# Patient Record
Sex: Male | Born: 1995 | Race: White | Hispanic: No | Marital: Single | Smoking: Never smoker
Health system: Southern US, Community
[De-identification: ages and names within clinical notes are randomized; demographics above are authoritative.]

---
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
## 2020-02-06 ENCOUNTER — Encounter (HOSPITAL_COMMUNITY): Payer: Self-pay | Admitting: Physician Assistant

## 2020-02-06 ENCOUNTER — Other Ambulatory Visit: Payer: Self-pay

## 2020-02-06 ENCOUNTER — Emergency Department (HOSPITAL_COMMUNITY)
Admission: EM | Admit: 2020-02-06 | Discharge: 2020-02-06 | Disposition: A | Discharge status: Home / Self Care | Payer: Medicaid Other | Attending: Emergency Medicine | Admitting: Emergency Medicine

## 2020-02-06 DIAGNOSIS — E860 Dehydration: Secondary | ICD-10-CM

## 2020-02-06 DIAGNOSIS — R112 Nausea with vomiting, unspecified: Secondary | ICD-10-CM

## 2020-02-06 DIAGNOSIS — R5383 Other fatigue: Secondary | ICD-10-CM | POA: Diagnosis not present

## 2020-02-06 DIAGNOSIS — R1110 Vomiting, unspecified: Secondary | ICD-10-CM | POA: Diagnosis present

## 2020-02-06 LAB — URINALYSIS, ROUTINE W REFLEX MICROSCOPIC
Bacteria, UA: NONE SEEN
Bilirubin Urine: NEGATIVE
Glucose, UA: NEGATIVE mg/dL
Hgb urine dipstick: NEGATIVE
Ketones, ur: 80 mg/dL — AB
Leukocytes,Ua: NEGATIVE
Nitrite: NEGATIVE
Protein, ur: 100 mg/dL — AB
Specific Gravity, Urine: 1.035 — ABNORMAL HIGH (ref 1.005–1.030)
pH: 5 (ref 5.0–8.0)

## 2020-02-06 LAB — COMPREHENSIVE METABOLIC PANEL
ALT: 22 U/L (ref 0–44)
AST: 28 U/L (ref 15–41)
Albumin: 5.3 g/dL — ABNORMAL HIGH (ref 3.5–5.0)
Alkaline Phosphatase: 101 U/L (ref 38–126)
Anion gap: 18 — ABNORMAL HIGH (ref 5–15)
BUN: 26 mg/dL — ABNORMAL HIGH (ref 6–20)
CO2: 20 mmol/L — ABNORMAL LOW (ref 22–32)
Calcium: 9.7 mg/dL (ref 8.9–10.3)
Chloride: 97 mmol/L — ABNORMAL LOW (ref 98–111)
Creatinine, Ser: <0.3 mg/dL — ABNORMAL LOW (ref 0.61–1.24)
Glucose, Bld: 114 mg/dL — ABNORMAL HIGH (ref 70–99)
Potassium: 3.9 mmol/L (ref 3.5–5.1)
Sodium: 135 mmol/L (ref 135–145)
Total Bilirubin: <0.1 mg/dL — ABNORMAL LOW (ref 0.3–1.2)
Total Protein: 4.4 g/dL — ABNORMAL LOW (ref 6.5–8.1)

## 2020-02-06 LAB — CBC
HCT: 42.6 % (ref 39.0–52.0)
Hemoglobin: 15 g/dL (ref 13.0–17.0)
MCH: 29.5 pg (ref 26.0–34.0)
MCHC: 35.2 g/dL (ref 30.0–36.0)
MCV: 83.9 fL (ref 80.0–100.0)
Platelets: 388 10*3/uL (ref 150–400)
RBC: 5.08 MIL/uL (ref 4.22–5.81)
RDW: 12.1 % (ref 11.5–15.5)
WBC: 13.2 10*3/uL — ABNORMAL HIGH (ref 4.0–10.5)
nRBC: 0 % (ref 0.0–0.2)

## 2020-02-06 LAB — LIPASE, BLOOD: Lipase: 18 U/L (ref 11–51)

## 2020-02-06 LAB — CK: Total CK: 282 U/L (ref 49–397)

## 2020-02-06 MED ORDER — ONDANSETRON HCL 4 MG PO TABS
4.0000 mg | ORAL_TABLET | Freq: Three times a day (TID) | ORAL | 0 refills | Status: DC | PRN
Start: 2020-02-06 — End: 2020-02-08

## 2020-02-06 MED ORDER — LACTATED RINGERS IV BOLUS
1000.0000 mL | Freq: Once | INTRAVENOUS | Status: AC
  Administered 2020-02-06: 1000 mL via INTRAVENOUS

## 2020-02-06 MED ORDER — PROMETHAZINE HCL 25 MG/ML IJ SOLN
12.5000 mg | Freq: Once | INTRAMUSCULAR | Status: AC
  Administered 2020-02-06: 12.5 mg via INTRAVENOUS
  Filled 2020-02-06: qty 1

## 2020-02-06 MED ORDER — PROMETHAZINE HCL 25 MG/ML IJ SOLN
25.0000 mg | Freq: Once | INTRAMUSCULAR | Status: AC
  Administered 2020-02-06: 25 mg via INTRAVENOUS
  Filled 2020-02-06: qty 1

## 2020-02-06 MED ORDER — LACTATED RINGERS IV BOLUS
2000.0000 mL | Freq: Once | INTRAVENOUS | Status: AC
  Administered 2020-02-06: 2000 mL via INTRAVENOUS

## 2020-02-06 MED ORDER — ONDANSETRON 4 MG PO TBDP
4.0000 mg | ORAL_TABLET | Freq: Once | ORAL | Status: AC | PRN
  Administered 2020-02-06: 4 mg via ORAL
  Filled 2020-02-06: qty 1

## 2020-02-06 NOTE — ED Notes (Addendum)
Patient requesting nausea meds before lab draw.Nancee Liter, RN made aware.

## 2020-02-06 NOTE — ED Provider Notes (Signed)
[facility]-EMERGENCY DEPT Provider Note   CSN: [id number] Arrival date & time: 02/06/20  1341     History Chief Complaint  Patient presents with  . Emesis    Juan Ross is a 24 y.o. male who presents today for evaluation of emesis.  He reports that Friday at noon he was working in [employer] when he got very hot and overheated and began vomiting.  He states that since then he has been continuously vomiting.  He denies any diarrhea.  He reports that his abdomen feels slightly uncomfortable due to the nausea however denies abdominal pain.  No fevers.  He has not been vaccinated against Covid and has not been previously diagnosed with Covid.  He denies any cough shortness of breath or headache.  Last bowel movement was a few days ago.  He denies any prior abdominal surgeries.  He states last time he smoked marijuana was 2 weeks ago.  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   HPI     History reviewed. No pertinent past medical history.  There are no problems to display for this patient.   History reviewed. No pertinent surgical history.     History reviewed. No pertinent family history.  Social History   Tobacco Use  . Smoking status: Never Smoker  . Smokeless tobacco: Never Used  Substance Use Topics  . Alcohol use: Not on file  . Drug use: Not on file    Home Medications Prior to Admission medications   Not on File    Allergies    Patient has no known allergies.  Review of Systems   Review of Systems  Constitutional: Positive for fatigue. Negative for chills and fever.  Eyes: Negative for visual disturbance.  Respiratory: Negative for cough, chest tightness and shortness of breath.   Cardiovascular: Negative for chest pain.  Gastrointestinal: Positive for nausea and vomiting. Negative for abdominal pain, constipation and diarrhea.  Genitourinary: Negative for decreased urine volume and  dysuria.  Musculoskeletal: Negative for back pain.  Skin: Negative for color change.  Neurological: Negative for weakness and headaches.  Psychiatric/Behavioral: Negative for confusion.  All other systems reviewed and are negative.   Physical Exam Updated Vital Signs BP 132/78   Pulse 68   Temp 98.5 F (36.9 C) (Oral)   Resp 16   Ht 5\' 4"  (1.626 m)   Wt 54.4 kg   SpO2 100%   BMI 20.60 kg/m   Physical Exam Vitals and nursing note reviewed.  Constitutional:      Appearance: He is well-developed.     Comments: Actively vomiting and dry heaving  HENT:     Head: Normocephalic and atraumatic.     Mouth/Throat:     Mouth: Mucous membranes are moist.  Eyes:     Conjunctiva/sclera: Conjunctivae normal.  Cardiovascular:     Rate and Rhythm: Normal rate and regular rhythm.     Pulses: Normal pulses.     Heart sounds: Normal heart sounds. No murmur.  Pulmonary:     Effort: Pulmonary effort is normal. No respiratory distress.     Breath sounds: Normal breath sounds.  Abdominal:     General: There is no distension.     Palpations: Abdomen is soft.     Tenderness: There is no abdominal tenderness.  Musculoskeletal:     Cervical back: Normal range of motion and neck supple.     Right lower leg: No edema.  Left lower leg: No edema.  Skin:    General: Skin is warm and dry.  Neurological:     Mental Status: He is alert.     Comments: Patient is awake and alert, speech is not slurred.  Answers questions appropriately.  Psychiatric:        Mood and Affect: Mood normal.        Behavior: Behavior normal.     ED Results / Procedures / Treatments   Labs (all labs ordered are listed, but only abnormal results are displayed) Labs Reviewed  COMPREHENSIVE METABOLIC PANEL - Abnormal; Notable for the following components:      Result Value   Chloride 97 (*)    CO2 20 (*)    Glucose, Bld 114 (*)    BUN 26 (*)    Creatinine, Ser <0.30 (*)    Total Protein 4.4 (*)    Albumin  5.3 (*)    Total Bilirubin <0.1 (*)    Anion gap 18 (*)    All other components within normal limits  CBC - Abnormal; Notable for the following components:   WBC 13.2 (*)    All other components within normal limits  URINALYSIS, ROUTINE W REFLEX MICROSCOPIC - Abnormal; Notable for the following components:   Specific Gravity, Urine 1.035 (*)    Ketones, ur 80 (*)    Protein, ur 100 (*)    All other components within normal limits  LIPASE, BLOOD  CK    EKG EKG Interpretation  Date/Time:  Saturday February 06 2020 17:26:19 EDT Ventricular Rate:  58 PR Interval:    QRS Duration: 122 QT Interval:  438 QTC Calculation: 431 R Axis:   83 Text Interpretation: Sinus rhythm Atrial premature complex Borderline short PR interval Nonspecific intraventricular conduction delay Nonspecific T abnrm, anterolateral leads No old tracing to compare Confirmed by Meridee Score [phone]) on 02/06/2020 5:42:41 PM   Radiology No results found.  Procedures Procedures (including critical care time)  Medications Ordered in ED Medications  ondansetron (ZOFRAN-ODT) disintegrating tablet 4 mg (4 mg Oral Given 02/06/20 1551)  promethazine (PHENERGAN) injection 12.5 mg (12.5 mg Intravenous Given 02/06/20 1647)  lactated ringers bolus 2,000 mL (0 mLs Intravenous Stopped 02/06/20 1756)    ED Course  I have reviewed the triage vital signs and the nursing notes.  Pertinent labs & imaging results that were available during my care of the patient were reviewed by me and considered in my medical decision making (see chart for details).    MDM Rules/Calculators/A&P                     Patient is a 24 year old man who presents today for evaluation of vomiting for approximately 24 hours.  On my exam he is actively vomiting and dry heaving.  He had been given Zofran ODT however this does not appear to have been effective.  2 L of IV fluids ordered in addition to IV Phenergan.  Given that this started while he was working  in a hot area CK was obtained which was not significantly elevated, white count slightly elevated at 13.2 which I suspect may be reactive.  CMP is significant for an anion gap of 18 with a CO2 of 20 which I suspect is secondary to vomiting.  His abdomen is soft, nontender nondistended.  He is afebrile here.  UA consistent with dehydration with elevated specific gravity and 80 ketones.  At this time patient is pending fluids, improvement in symptoms and  p.o. challenge along with repeat abdomen exam.  At shift change care was transferred to Dr. Melina Copa  who will follow pending studies, re-evaulate and determine disposition.     Final Clinical Impression(s) / ED Diagnoses Final diagnoses:  Non-intractable vomiting with nausea, unspecified vomiting type  Dehydration    Rx / DC Orders ED Discharge Orders    None       Ollen Gross 02/06/20 1848    Hayden Rasmussen, MD 02/07/20 [phone]

## 2020-02-06 NOTE — ED Triage Notes (Signed)
Per patient, he was working at his job yesterday and got too hot. Started vomiting around noon and hasnt been able to stop since. Denies pain

## 2020-02-08 ENCOUNTER — Other Ambulatory Visit: Payer: Self-pay

## 2020-02-08 ENCOUNTER — Encounter (HOSPITAL_COMMUNITY): Payer: Self-pay

## 2020-02-08 ENCOUNTER — Emergency Department (HOSPITAL_COMMUNITY)
Admission: EM | Admit: 2020-02-08 | Discharge: 2020-02-08 | Disposition: A | Discharge status: Home / Self Care | Payer: Medicaid Other | Attending: Emergency Medicine | Admitting: Emergency Medicine

## 2020-02-08 ENCOUNTER — Emergency Department (HOSPITAL_COMMUNITY): Payer: Medicaid Other

## 2020-02-08 DIAGNOSIS — R112 Nausea with vomiting, unspecified: Secondary | ICD-10-CM | POA: Diagnosis not present

## 2020-02-08 DIAGNOSIS — R1030 Lower abdominal pain, unspecified: Secondary | ICD-10-CM | POA: Insufficient documentation

## 2020-02-08 LAB — CBC WITH DIFFERENTIAL/PLATELET
Abs Immature Granulocytes: 0.05 10*3/uL (ref 0.00–0.07)
Basophils Absolute: 0 10*3/uL (ref 0.0–0.1)
Basophils Relative: 0 %
Eosinophils Absolute: 0 10*3/uL (ref 0.0–0.5)
Eosinophils Relative: 0 %
HCT: 36.3 % — ABNORMAL LOW (ref 39.0–52.0)
Hemoglobin: 13 g/dL (ref 13.0–17.0)
Immature Granulocytes: 1 %
Lymphocytes Relative: 9 %
Lymphs Abs: 0.8 10*3/uL (ref 0.7–4.0)
MCH: 29.7 pg (ref 26.0–34.0)
MCHC: 35.8 g/dL (ref 30.0–36.0)
MCV: 82.9 fL (ref 80.0–100.0)
Monocytes Absolute: 0.7 10*3/uL (ref 0.1–1.0)
Monocytes Relative: 8 %
Neutro Abs: 7 10*3/uL (ref 1.7–7.7)
Neutrophils Relative %: 82 %
Platelets: 297 10*3/uL (ref 150–400)
RBC: 4.38 MIL/uL (ref 4.22–5.81)
RDW: 11.9 % (ref 11.5–15.5)
WBC: 8.6 10*3/uL (ref 4.0–10.5)
nRBC: 0 % (ref 0.0–0.2)

## 2020-02-08 LAB — LIPASE, BLOOD: Lipase: 33 U/L (ref 11–51)

## 2020-02-08 LAB — COMPREHENSIVE METABOLIC PANEL
ALT: 18 U/L (ref 0–44)
AST: 22 U/L (ref 15–41)
Albumin: 4.8 g/dL (ref 3.5–5.0)
Alkaline Phosphatase: 85 U/L (ref 38–126)
Anion gap: 16 — ABNORMAL HIGH (ref 5–15)
BUN: 15 mg/dL (ref 6–20)
CO2: 27 mmol/L (ref 22–32)
Calcium: 9.2 mg/dL (ref 8.9–10.3)
Chloride: 93 mmol/L — ABNORMAL LOW (ref 98–111)
Creatinine, Ser: 0.83 mg/dL (ref 0.61–1.24)
GFR calc Af Amer: >60 mL/min (ref >60)
GFR calc non Af Amer: >60 mL/min (ref >60)
Glucose, Bld: 110 mg/dL — ABNORMAL HIGH (ref 70–99)
Potassium: 3.1 mmol/L — ABNORMAL LOW (ref 3.5–5.1)
Sodium: 136 mmol/L (ref 135–145)
Total Bilirubin: 1.5 mg/dL — ABNORMAL HIGH (ref 0.3–1.2)
Total Protein: 7.7 g/dL (ref 6.5–8.1)

## 2020-02-08 LAB — URINALYSIS, ROUTINE W REFLEX MICROSCOPIC
Bilirubin Urine: NEGATIVE
Glucose, UA: NEGATIVE mg/dL
Hgb urine dipstick: NEGATIVE
Ketones, ur: 80 mg/dL — AB
Leukocytes,Ua: NEGATIVE
Nitrite: NEGATIVE
Protein, ur: NEGATIVE mg/dL
Specific Gravity, Urine: 1.039 — ABNORMAL HIGH (ref 1.005–1.030)
pH: 7 (ref 5.0–8.0)

## 2020-02-08 LAB — MAGNESIUM: Magnesium: 2.3 mg/dL (ref 1.7–2.4)

## 2020-02-08 MED ORDER — LORAZEPAM 2 MG/ML IJ SOLN
0.5000 mg | Freq: Once | INTRAMUSCULAR | Status: AC
  Administered 2020-02-08: 0.5 mg via INTRAVENOUS
  Filled 2020-02-08: qty 1

## 2020-02-08 MED ORDER — POTASSIUM CHLORIDE ER 10 MEQ PO TBCR: 20.0000 meq | EXTENDED_RELEASE_TABLET | Freq: Every day | ORAL | 0 refills | Status: AC

## 2020-02-08 MED ORDER — SODIUM CHLORIDE 0.9% FLUSH
3.0000 mL | Freq: Once | INTRAVENOUS | Status: AC
  Administered 2020-02-08: 3 mL via INTRAVENOUS

## 2020-02-08 MED ORDER — SODIUM CHLORIDE 0.9 % IV BOLUS
1000.0000 mL | Freq: Once | INTRAVENOUS | Status: AC
  Administered 2020-02-08: 1000 mL via INTRAVENOUS

## 2020-02-08 MED ORDER — ONDANSETRON HCL 4 MG/2ML IJ SOLN
4.0000 mg | Freq: Once | INTRAMUSCULAR | Status: AC
  Administered 2020-02-08: 4 mg via INTRAVENOUS
  Filled 2020-02-08: qty 2

## 2020-02-08 MED ORDER — SODIUM CHLORIDE 0.9 % IV BOLUS
500.0000 mL | Freq: Once | INTRAVENOUS | Status: AC
  Administered 2020-02-08: 500 mL via INTRAVENOUS

## 2020-02-08 MED ORDER — SODIUM CHLORIDE (PF) 0.9 % IJ SOLN
INTRAMUSCULAR | Status: AC
  Filled 2020-02-08: qty 50

## 2020-02-08 MED ORDER — POTASSIUM CHLORIDE CRYS ER 20 MEQ PO TBCR
40.0000 meq | EXTENDED_RELEASE_TABLET | Freq: Once | ORAL | Status: DC
  Filled 2020-02-08: qty 2

## 2020-02-08 MED ORDER — MORPHINE SULFATE (PF) 2 MG/ML IV SOLN
2.0000 mg | Freq: Once | INTRAVENOUS | Status: AC
  Administered 2020-02-08: 2 mg via INTRAVENOUS
  Filled 2020-02-08: qty 1

## 2020-02-08 MED ORDER — PROMETHAZINE HCL 12.5 MG PO TABS: 12.5000 mg | ORAL_TABLET | Freq: Four times a day (QID) | ORAL | 0 refills | Status: AC | PRN

## 2020-02-08 MED ORDER — POTASSIUM CHLORIDE 10 MEQ/100ML IV SOLN
10.0000 meq | Freq: Once | INTRAVENOUS | Status: AC
  Administered 2020-02-08: 10 meq via INTRAVENOUS
  Filled 2020-02-08: qty 100

## 2020-02-08 MED ORDER — HALOPERIDOL LACTATE 5 MG/ML IJ SOLN
2.0000 mg | Freq: Once | INTRAMUSCULAR | Status: AC
  Administered 2020-02-08: 2 mg via INTRAVENOUS
  Filled 2020-02-08: qty 1

## 2020-02-08 MED ORDER — IOHEXOL 300 MG/ML  SOLN
100.0000 mL | Freq: Once | INTRAMUSCULAR | Status: AC | PRN
  Administered 2020-02-08: 100 mL via INTRAVENOUS

## 2020-02-08 NOTE — ED Provider Notes (Signed)
[facility]-EMERGENCY DEPT Provider Note   CSN: [id number] Arrival date & time: 02/08/20  2703     History Chief Complaint  Patient presents with  . Emesis    Juan Ross is a 24 y.o. male otherwise healthy no daily medication use presents today for nausea vomiting.  Patient reports nausea and vomiting began on Friday after working out heat on 02/06/2020.  He reports multiple episodes of nonbloody/nonbilious emesis since that time.  He reports he was seen in the ER that same day and had blood work done and received IV fluids, he reports he was discharged with nausea medication which has not helped with his symptoms.  Patient reports that over the last day he has developed abdominal pain he describes an aching sensation moderate primarily in the lower abdomen constant worsened with vomiting no alleviating factors.  Denies fever, headache, sore throat, chest pain/shortness of breath, diarrhea, dysuria/hematuria, testicular pain/swelling or any additional concerns.  Of note patient reports that he has used marijuana in the past but has not smoked in the last 2 weeks. HPI     History reviewed. No pertinent past medical history.  There are no problems to display for this patient.   History reviewed. No pertinent surgical history.     History reviewed. No pertinent family history.  Social History   Tobacco Use  . Smoking status: Never Smoker  . Smokeless tobacco: Never Used  Substance Use Topics  . Alcohol use: Not on file  . Drug use: Not on file    Home Medications Prior to Admission medications   Medication Sig Start Date End Date Taking? Authorizing Provider  potassium chloride (KLOR-CON) 10 MEQ tablet Take 2 tablets (20 mEq total) by mouth daily for 3 days. 02/08/20 02/11/20  Harlene Salts A, PA-C  promethazine (PHENERGAN) 12.5 MG tablet Take 1 tablet (12.5 mg total) by mouth every 6 (six) hours as needed for nausea or vomiting. 02/08/20   Bill Salinas, PA-C    Allergies    Patient has no known allergies.  Review of Systems   Review of Systems Ten systems are reviewed and are negative for acute change except as noted in the HPI Physical Exam Updated Vital Signs BP 126/71 (BP Location: Right Arm)   Pulse 92   Temp 99.1 F (37.3 C) (Oral)   Resp 20   Ht 5\' 4"  (1.626 m)   Wt 55 kg   SpO2 99%   BMI 20.81 kg/m   Physical Exam Constitutional:      General: He is not in acute distress.    Appearance: Normal appearance. He is well-developed. He is not ill-appearing or diaphoretic.  HENT:     Head: Normocephalic and atraumatic.  Eyes:     General: Vision grossly intact. Gaze aligned appropriately.     Pupils: Pupils are equal, round, and reactive to light.  Neck:     Trachea: Trachea and phonation normal.  Pulmonary:     Effort: Pulmonary effort is normal. No respiratory distress.  Abdominal:     General: There is no distension.     Palpations: Abdomen is soft.     Tenderness: There is abdominal tenderness in the right lower quadrant. There is no guarding or rebound. Positive signs include McBurney's sign. Negative signs include Murphy's sign.  Musculoskeletal:        General: Normal range of motion.     Cervical back: Normal range of motion.  Skin:    General: Skin  is warm and dry.  Neurological:     Mental Status: He is alert.     GCS: GCS eye subscore is 4. GCS verbal subscore is 5. GCS motor subscore is 6.     Comments: Speech is clear and goal oriented, follows commands Major Cranial nerves without deficit, no facial droop Moves extremities without ataxia, coordination intact  Psychiatric:        Behavior: Behavior normal.     ED Results / Procedures / Treatments   Labs (all labs ordered are listed, but only abnormal results are displayed) Labs Reviewed  COMPREHENSIVE METABOLIC PANEL - Abnormal; Notable for the following components:      Result Value   Potassium 3.1 (*)    Chloride 93 (*)     Glucose, Bld 110 (*)    Total Bilirubin 1.5 (*)    Anion gap 16 (*)    All other components within normal limits  URINALYSIS, ROUTINE W REFLEX MICROSCOPIC - Abnormal; Notable for the following components:   Specific Gravity, Urine 1.039 (*)    Ketones, ur 80 (*)    All other components within normal limits  CBC WITH DIFFERENTIAL/PLATELET - Abnormal; Notable for the following components:   HCT 36.3 (*)    All other components within normal limits  LIPASE, BLOOD  MAGNESIUM    EKG EKG Interpretation  Date/Time:  Monday February 08 2020 09:41:40 EDT Ventricular Rate:  55 PR Interval:    QRS Duration: 115 QT Interval:  435 QTC Calculation: 416 R Axis:   83 Text Interpretation: Sinus rhythm Nonspecific intraventricular conduction delay No significant change since last tracing Confirmed by Dorie Rank [phone]) on 02/08/2020 9:44:44 AM   Radiology CT ABDOMEN PELVIS W CONTRAST  Result Date: 02/08/2020 CLINICAL DATA:  Unable to keep anything down since Thursday, stomach soreness, malaise, RIGHT lower quadrant pain EXAM: CT ABDOMEN AND PELVIS WITH CONTRAST TECHNIQUE: Multidetector CT imaging of the abdomen and pelvis was performed using the standard protocol following bolus administration of intravenous contrast. Sagittal and coronal MPR images reconstructed from axial data set. CONTRAST:  118mL OMNIPAQUE IOHEXOL 300 MG/ML SOLN IV. No oral contrast. COMPARISON:  12/18/2018 FINDINGS: Lower chest: Lung bases clear Hepatobiliary: Minimal focal fatty infiltration of the liver. Gallbladder and liver otherwise normal appearance Pancreas: Normal appearance Spleen: Normal appearance Adrenals/Urinary Tract: Adrenal glands, kidneys, ureters and bladder normal appearance Stomach/Bowel: Normal appendix. Stomach and bowel loops normal appearance Vascular/Lymphatic: Aorta normal caliber. Vascular structures patent. No adenopathy. Scattered pelvic phleboliths. Reproductive: Unremarkable Other: Small umbilical hernia  containing fat. No free air or free fluid. No inflammatory process. Musculoskeletal: Unremarkable IMPRESSION: Small umbilical hernia containing fat. No acute intra-abdominal or intrapelvic abnormalities. Electronically Signed   By: Lavonia Dana M.D.   On: 02/08/2020 09:39    Procedures Procedures (including critical care time)  Medications Ordered in ED Medications  potassium chloride SA (KLOR-CON) CR tablet 40 mEq (40 mEq Oral Refused 02/08/20 0857)  sodium chloride (PF) 0.9 % injection (has no administration in time range)  sodium chloride flush (NS) 0.9 % injection 3 mL (3 mLs Intravenous Given 02/08/20 0701)  ondansetron (ZOFRAN) injection 4 mg (4 mg Intravenous Given 02/08/20 0700)  morphine 2 MG/ML injection 2 mg (2 mg Intravenous Given 02/08/20 0719)  sodium chloride 0.9 % bolus 1,000 mL (0 mLs Intravenous Stopped 02/08/20 0857)  iohexol (OMNIPAQUE) 300 MG/ML solution 100 mL (100 mLs Intravenous Contrast Given 02/08/20 0909)  haloperidol lactate (HALDOL) injection 2 mg (2 mg Intravenous Given 02/08/20 0957)  potassium chloride 10 mEq in 100 mL IVPB (0 mEq Intravenous Stopped 02/08/20 1102)  sodium chloride 0.9 % bolus 500 mL (0 mLs Intravenous Stopped 02/08/20 1034)  LORazepam (ATIVAN) injection 0.5 mg (0.5 mg Intravenous Given 02/08/20 1033)    ED Course  I have reviewed the triage vital signs and the nursing notes.  Pertinent labs & imaging results that were available during my care of the patient were reviewed by me and considered in my medical decision making (see chart for details).    MDM Rules/Calculators/A&P                     Additional History Obtained: 1. Nursing notes from this visit. 2. Prior ED visit on February 06, 2020.  Diagnosis non-intractable nausea/vomiting as well as dehydration.  Patient had blood work obtained including CK.  He received 3 L of lactated Ringer's was discharged with Zofran. --- I ordered, reviewed and interpreted labs which include: CBC shows no leukocytosis to  suggest infection or evidence of anemia. CMP shows mild hypokalemia of 3.1, no emergent derangement or evidence of acute kidney injury, no emergent elevation of LFTs. Gap of 16 suspected secondary to n/v. Lipase within normal limits doubt pancreatitis. Magnesium meds.  Given patient's second visit for nausea vomiting and with new abdominal pain and tenderness in the right lower quadrant CT abdomen pelvis was obtained.  CT AP:  IMPRESSION:  Small umbilical hernia containing fat.    No acute intra-abdominal or intrapelvic abnormalities.  - Reassuring work-up above, urinalysis pending.  Pain and nausea were initially controlled with Zofran and morphine.  He had some return of nausea on reassessment.  Concern for possible cyclical nausea and vomiting due to marijuana use 2 mg Haldol was given.  Shortly after patient became anxious on the outlines, Ativan ordered. Discussed case with Dr. Lynelle Doctor who agrees with plan. --------------- Patient much calmer after receiving Ativan, on reassessment he is resting comfortably no acute distress reports he is feeling well.  Urinalysis resulted shows 80 ketones which I suspect is from dehydration, he is not a diabetic, he does have a small gap which likely secondary from dehydration, normal bicarb doubt DKA, suspect dehydration.  Discussed with Dr. Lynelle Doctor who agrees.  He is requesting discharge, plan of care at this time is to p.o. challenge if successful will discharge with Phenergan. - Patient was p.o. challenged by nursing staff, no difficulty.  He is requesting discharge.  Will change patient's prescription from Zofran to Phenergan 12.5 mg every 6 as needed.  Advised to stop using marijuana, increase water intake and have recheck at PCPs next week.  Patient prescribed potassium supplements for the next 3 days, suspect potassium level will continue to improve as he returns to a normal diet.  Suspect symptoms may be due to viral gastroenteritis versus cyclical  nausea vomiting possibly secondary to marijuana use.  No evidence of acute intra-abdominal/pelvic pathology requiring further work-up, on reassessment he is well-appearing no acute distress, vital signs stable.  At this time there does not appear to be any evidence of an acute emergency medical condition and the patient appears stable for discharge with appropriate outpatient follow up. Diagnosis was discussed with patient who verbalizes understanding of care plan and is agreeable to discharge. I have discussed return precautions with patient who verbalizes understanding. Patient encouraged to follow-up with their PCP. All questions answered.   Note: Portions of this report may have been transcribed using voice recognition software. Every effort was made  to ensure accuracy; however, inadvertent computerized transcription errors may still be present. Final Clinical Impression(s) / ED Diagnoses Final diagnoses:  Non-intractable vomiting with nausea, unspecified vomiting type    Rx / DC Orders ED Discharge Orders         Ordered    promethazine (PHENERGAN) 12.5 MG tablet  Every 6 hours PRN     02/08/20 1325    potassium chloride (KLOR-CON) 10 MEQ tablet  Daily     02/08/20 1325           Elizabeth Palau 02/08/20 1331    Linwood Dibbles, MD 02/09/20 [phone]

## 2020-02-08 NOTE — Discharge Instructions (Addendum)
At this time there does not appear to be the presence of an emergent medical condition, however there is always the potential for conditions to change. Please read and follow the below instructions.  Please return to the Emergency Department immediately for any new or worsening symptoms or if your symptoms do not improve within 3 days. Please be sure to follow up with your Primary Care Provider within one week regarding your visit today; please call their office to schedule an appointment even if you are feeling better for a follow-up visit. You may use the medication Phenergan as prescribed to help with nausea and vomiting.  Stop using marijuana as this can exacerbate nausea and vomiting.  Drink plenty water to avoid dehydration and get lots of rest. Your CT scan today showed a small umbilical hernia containing fat, be sure to discuss this with your primary care doctor at your follow-up visit. Your potassium level was low in the ER today, please take your potassium supplements as prescribed to help with this.  Please have your blood work and potassium level rechecked at your primary care doctor's office next week.  Get help right away if: You have pain in your chest, neck, arm, or jaw. You feel very weak or you pass out (faint). You throw up again and again. You have throw up that is bright red or looks like black coffee grounds. You have bloody or black poop (stools) or poop that looks like tar. You have a very bad headache, a stiff neck, or both. You have very bad pain, cramping, or bloating in your belly (abdomen). You have trouble breathing. You are breathing very quickly. Your heart is beating very quickly. Your skin feels cold and clammy. You feel confused. You have signs of losing too much water in your body, such as: Dark pee, very little pee, or no pee. Cracked lips. Dry mouth. Sunken eyes. Sleepiness. Weakness. You have any new/concerning or worsening of symptoms  Please read  the additional information packets attached to your discharge summary.  Do not take your medicine if  develop an itchy rash, swelling in your mouth or lips, or difficulty breathing; call 911 and seek immediate emergency medical attention if this occurs.  Note: Portions of this text may have been transcribed using voice recognition software. Every effort was made to ensure accuracy; however, inadvertent computerized transcription errors may still be present.

## 2020-02-08 NOTE — ED Triage Notes (Addendum)
Pt states that he hasn't been able to keep anything down since Thursday. States that his stomach is sore. Endorses general malaise. Pt had a difficult time tolerating his blood pressure check stating that it made his arm really numb. A&Ox4. Ambulatory.

## 2020-02-08 NOTE — ED Notes (Signed)
Patient was offered food and beverage. He tolerated PO challenge well.

## 2020-02-08 NOTE — ED Notes (Addendum)
Patient having anxiety as a result of haldol, pulling at lines, unable to sit still and getting aggravated over the sound of his IV pump pumping fluids, PA aware.

## 2021-03-23 IMAGING — CT CT ABD-PELV W/ CM
2 of 4 series · 16 of 46 positions shown, 18 images · IV contrast (omnipaque)
Comparison: 12/18/2018

CLINICAL DATA: Unable to keep anything down since [REDACTED], stomach
soreness, malaise, RIGHT lower quadrant pain

EXAM:
CT ABDOMEN AND PELVIS WITH CONTRAST
TECHNIQUE: Multidetector CT imaging of the abdomen and pelvis was performed
using the standard protocol following bolus administration of
intravenous contrast. Sagittal and coronal MPR images reconstructed
from axial data set.
CONTRAST:  100mL OMNIPAQUE IOHEXOL 300 MG/ML SOLN IV. No oral
contrast.

[Series 2: axial st · axial · 0.63mm/px · z∈[-443,-68]mm · 13 of 86 slices shown, 15 images]
[im 6/86  soft-tissue]
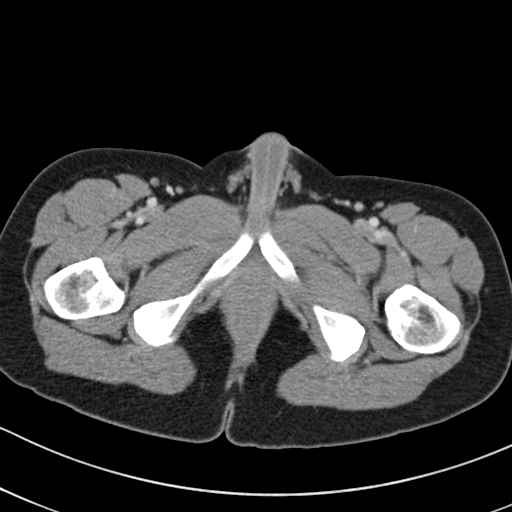
[im 6/86  bone]
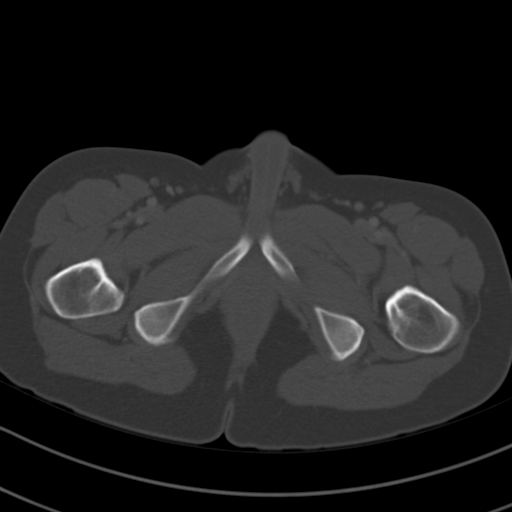
[im 11/86  soft-tissue]
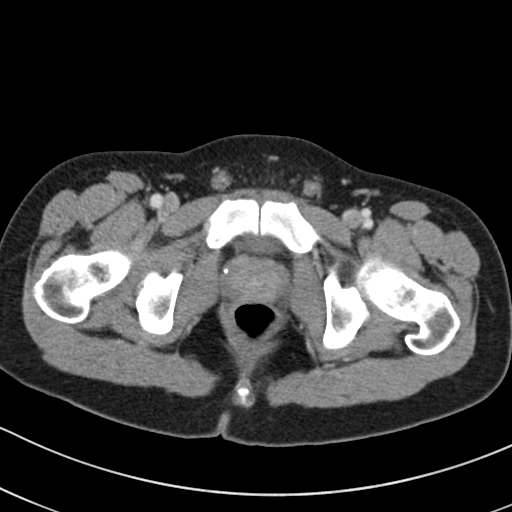
[im 21/86  soft-tissue]
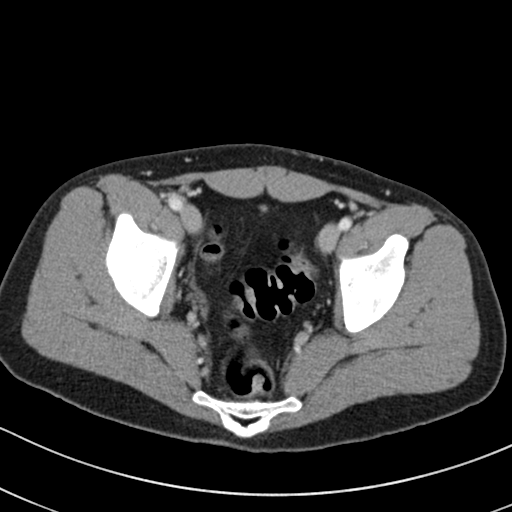
[im 26/86  soft-tissue]
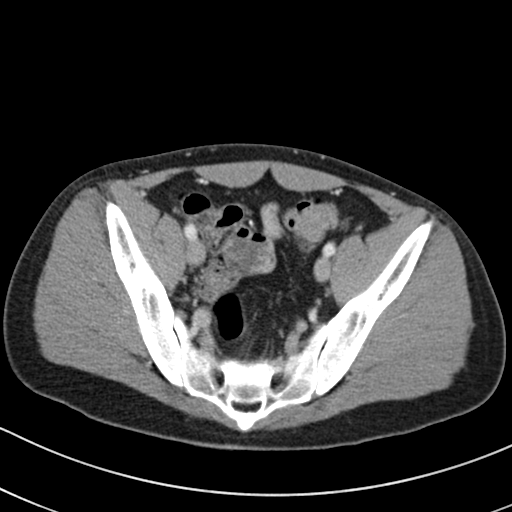
[im 31/86  soft-tissue]
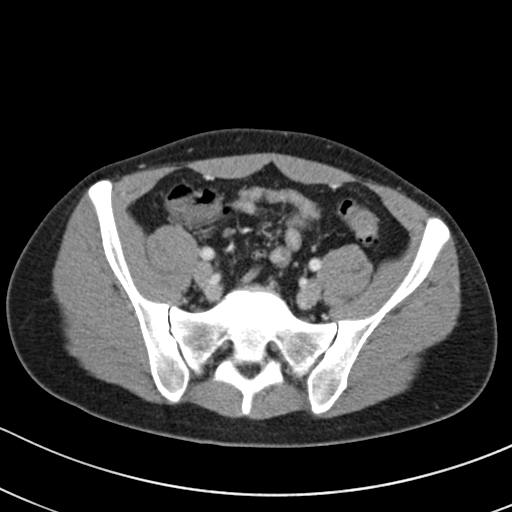
[im 36/86  soft-tissue]
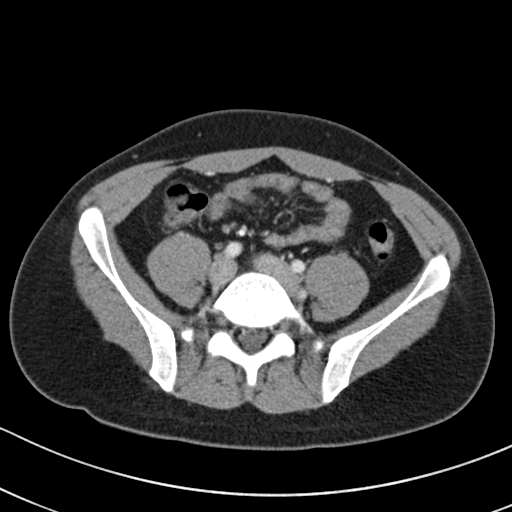
[im 46/86  soft-tissue]
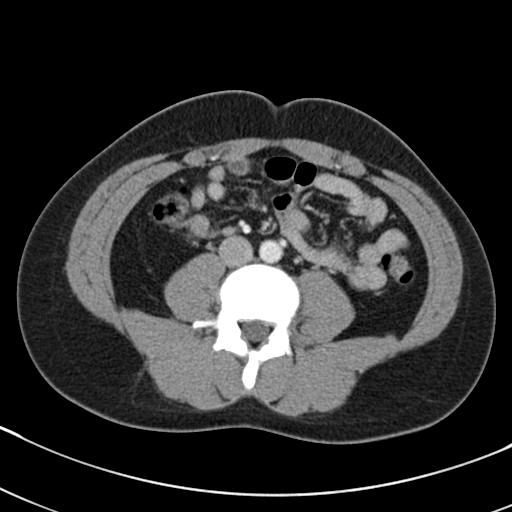
[im 51/86  soft-tissue]
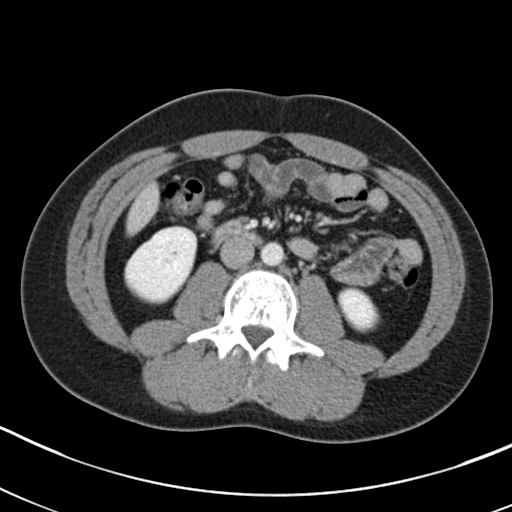
[im 56/86  soft-tissue]
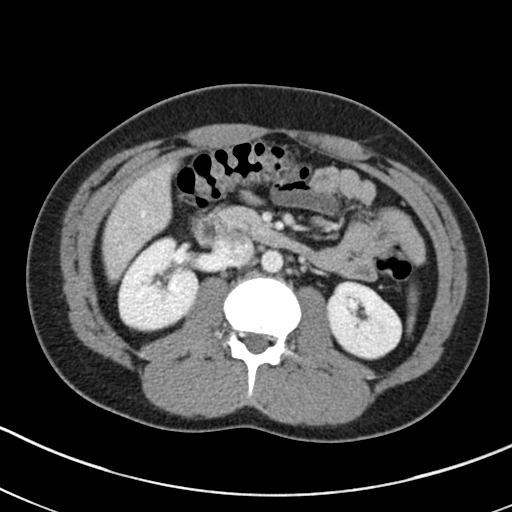
[im 56/86  bone]
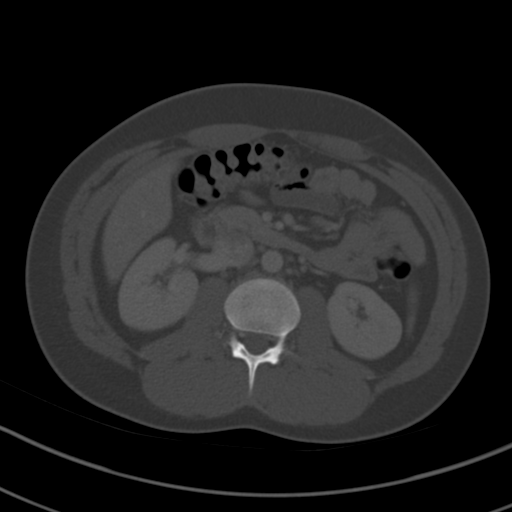
[im 61/86  soft-tissue]
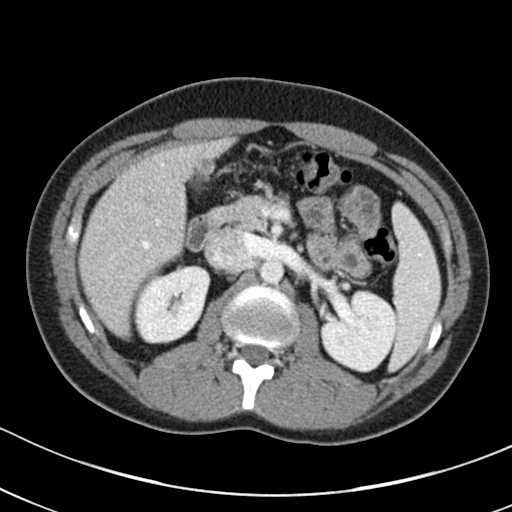
[im 66/86  soft-tissue]
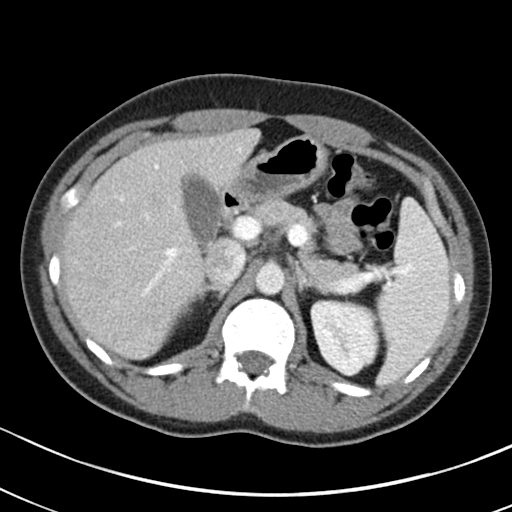
[im 76/86  soft-tissue]
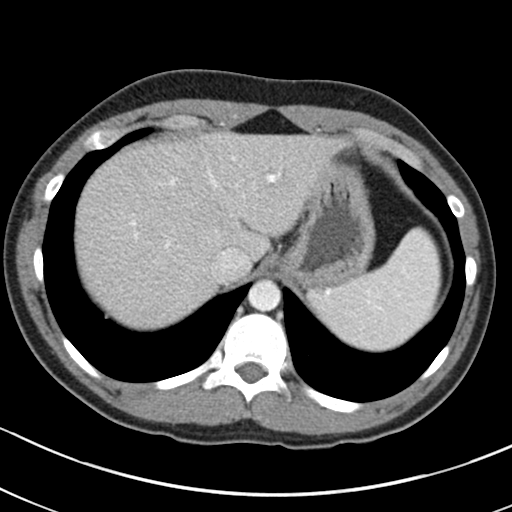
[im 81/86  soft-tissue]
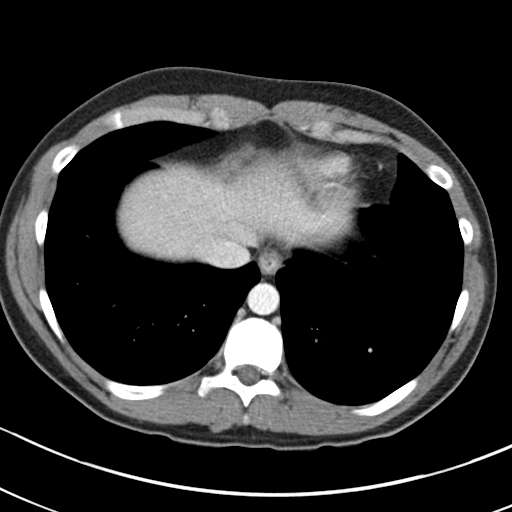

[Series 5: coronal st · coronal · 0.64mm/px · 3 of 112 slices shown]
[im 38/112  soft-tissue]
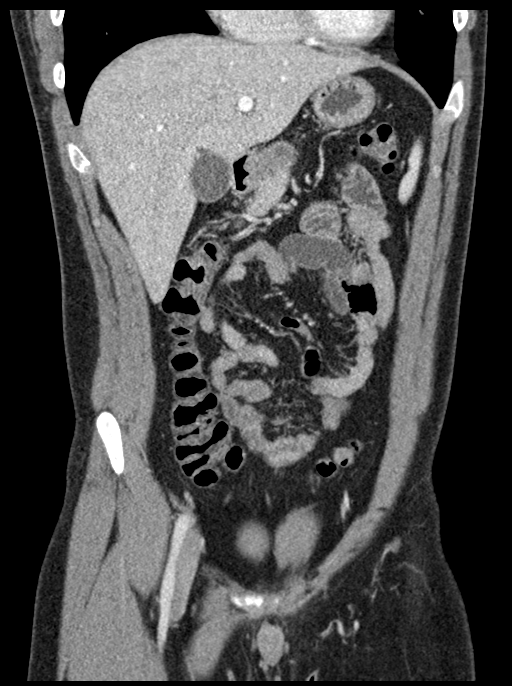
[im 50/112  soft-tissue]
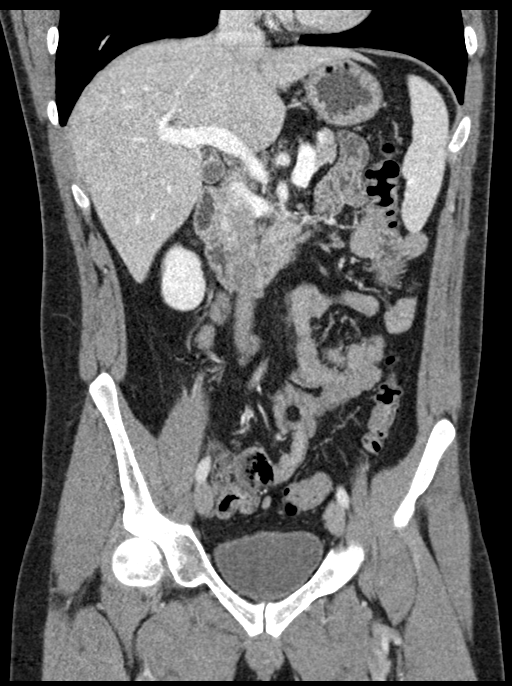
[im 62/112  soft-tissue]
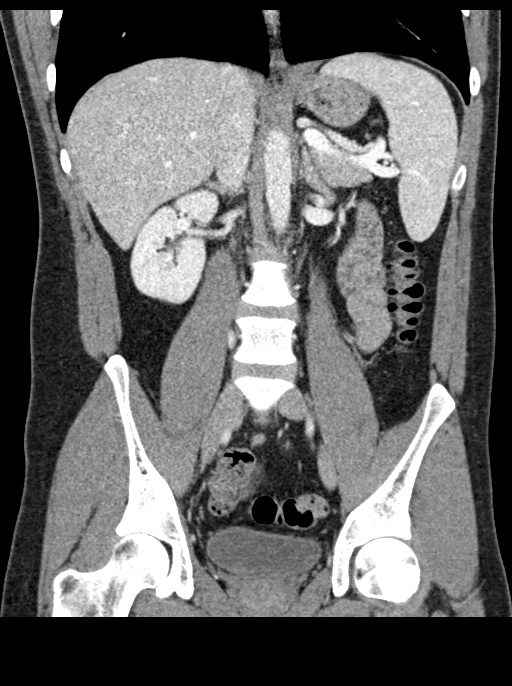

[16 of 46 positions shown; findings below may reference images not displayed]

FINDINGS: Lower chest: Lung bases clear

Hepatobiliary: Minimal focal fatty infiltration of the liver.
Gallbladder and liver otherwise normal appearance

Pancreas: Normal appearance

Spleen: Normal appearance

Adrenals/Urinary Tract: Adrenal glands, kidneys, ureters and bladder
normal appearance

Stomach/Bowel: Normal appendix. Stomach and bowel loops normal
appearance

Vascular/Lymphatic: Aorta normal caliber. Vascular structures
patent. No adenopathy. Scattered pelvic phleboliths.

Reproductive: Unremarkable

Other: Small umbilical hernia containing fat. No free air or free
fluid. No inflammatory process.

Musculoskeletal: Unremarkable
IMPRESSION: Small umbilical hernia containing fat.

No acute intra-abdominal or intrapelvic abnormalities.
